# Patient Record
Sex: Female | Born: 1990 | Race: Black or African American | Hispanic: No | Smoking: Never smoker
Health system: Southern US, Community
[De-identification: ages and names within clinical notes are randomized; demographics above are authoritative.]

---

## 2013-11-25 ENCOUNTER — Ambulatory Visit (INDEPENDENT_AMBULATORY_CARE_PROVIDER_SITE_OTHER): Payer: Managed Care, Other (non HMO) | Admitting: Obstetrics & Gynecology

## 2013-11-25 ENCOUNTER — Encounter: Payer: Self-pay | Admitting: Obstetrics & Gynecology

## 2013-11-25 VITALS — BP 110/73 | Ht 62.0 in | Wt 119.0 lb

## 2013-11-25 DIAGNOSIS — Z3201 Encounter for pregnancy test, result positive: Secondary | ICD-10-CM

## 2013-11-25 DIAGNOSIS — R110 Nausea: Secondary | ICD-10-CM

## 2013-11-25 DIAGNOSIS — Z3400 Encounter for supervision of normal first pregnancy, unspecified trimester: Secondary | ICD-10-CM | POA: Insufficient documentation

## 2013-11-25 DIAGNOSIS — N949 Unspecified condition associated with female genital organs and menstrual cycle: Secondary | ICD-10-CM

## 2013-11-25 NOTE — Progress Notes (Signed)
P - 95 - Pt states she has had some abd cramping 2/10 pain scale - also started feeling nauseated 2 days ago but has not vomited.

## 2013-11-25 NOTE — Progress Notes (Signed)
Patient reports having positive UPT at home in November, LMP 09/21/13.  Clinic UPT is also positive today.  Clinic bedside transabdominal ultrasound showed possible GS and yolk sac, no discrete fetal pole or cardiac activity noted.  Patient denies any current pain, no tenderness on lower abdomen, LLQ or RLQ palpation.  Will obtain formal scan for viability check, will follow up results and manage accordingly.  Pain and bleeding precautions advised

## 2013-11-25 NOTE — Patient Instructions (Signed)
We will call you with results  

## 2013-11-27 ENCOUNTER — Ambulatory Visit (HOSPITAL_COMMUNITY)
Admission: RE | Admit: 2013-11-27 | Discharge: 2013-11-27 | Disposition: A | Discharge status: Home / Self Care | Payer: Managed Care, Other (non HMO) | Source: Ambulatory Visit | Attending: Obstetrics & Gynecology | Admitting: Obstetrics & Gynecology

## 2013-11-27 ENCOUNTER — Ambulatory Visit (HOSPITAL_COMMUNITY): Payer: Managed Care, Other (non HMO)

## 2013-11-27 DIAGNOSIS — Z3201 Encounter for pregnancy test, result positive: Secondary | ICD-10-CM

## 2013-11-27 DIAGNOSIS — Z3689 Encounter for other specified antenatal screening: Secondary | ICD-10-CM | POA: Insufficient documentation

## 2013-12-01 ENCOUNTER — Telehealth: Payer: Self-pay | Admitting: *Deleted

## 2013-12-01 NOTE — Telephone Encounter (Signed)
Called pt to go over u/s results - pt expressed understanding

## 2013-12-01 NOTE — Telephone Encounter (Signed)
Message copied by Arne Cleveland on Mon Dec 01, 2013  8:21 AM ------      Message from: Jaynie Collins A      Created: Sat Nov 29, 2013  9:47 AM       Please schedule for initial prenatal visit soon.  Dating by this ultrasound.  So relieved by this result... ------

## 2013-12-09 ENCOUNTER — Ambulatory Visit (INDEPENDENT_AMBULATORY_CARE_PROVIDER_SITE_OTHER): Payer: Managed Care, Other (non HMO) | Admitting: Obstetrics & Gynecology

## 2013-12-09 ENCOUNTER — Encounter: Payer: Self-pay | Admitting: Obstetrics & Gynecology

## 2013-12-09 VITALS — BP 118/71 | Wt 116.0 lb

## 2013-12-09 DIAGNOSIS — Z3400 Encounter for supervision of normal first pregnancy, unspecified trimester: Secondary | ICD-10-CM

## 2013-12-09 DIAGNOSIS — Z3401 Encounter for supervision of normal first pregnancy, first trimester: Secondary | ICD-10-CM

## 2013-12-09 NOTE — Patient Instructions (Signed)
Pregnancy - First Trimester  During sexual intercourse, millions of sperm go into the vagina. Only 1 sperm will penetrate and fertilize the female egg while it is in the Fallopian tube. One week later, the fertilized egg implants into the wall of the uterus. An embryo begins to develop into a baby. At 6 to 8 weeks, the eyes and face are formed and the heartbeat can be seen on ultrasound. At the end of 12 weeks (first trimester), all the baby's organs are formed. Now that you are pregnant, you will want to do everything you can to have a healthy baby. Two of the most important things are to get good prenatal care and follow your caregiver's instructions. Prenatal care is all the medical care you receive before the baby's birth. It is given to prevent, find, and treat problems during the pregnancy and childbirth.  PRENATAL EXAMS  · During prenatal visits, your weight, blood pressure, and urine are checked. This is done to make sure you are healthy and progressing normally during the pregnancy.  · A pregnant woman should gain 25 to 35 pounds during the pregnancy. However, if you are overweight or underweight, your caregiver will advise you regarding your weight.  · Your caregiver will ask and answer questions for you.  · Blood work, cervical cultures, other necessary tests, and a Pap test are done during your prenatal exams. These tests are done to check on your health and the probable health of your baby. Tests are strongly recommended and done for HIV with your permission. This is the virus that causes AIDS. These tests are done because medicines can be given to help prevent your baby from being born with this infection should you have been infected without knowing it. Blood work is also used to find out your blood type, previous infections, and follow your blood levels (hemoglobin).  · Low hemoglobin (anemia) is common during pregnancy. Iron and vitamins are given to help prevent this. Later in the pregnancy, blood  tests for diabetes will be done along with any other tests if any problems develop.  · You may need other tests to make sure you and the baby are doing well.  CHANGES DURING THE FIRST TRIMESTER   Your body goes through many changes during pregnancy. They vary from person to person. Talk to your caregiver about changes you notice and are concerned about. Changes can include:  · Your menstrual period stops.  · The egg and sperm carry the genes that determine what you look like. Genes from you and your partner are forming a baby. The female genes determine whether the baby is a boy or a girl.  · Your body increases in girth and you may feel bloated.  · Feeling sick to your stomach (nauseous) and throwing up (vomiting). If the vomiting is uncontrollable, call your caregiver.  · Your breasts will begin to enlarge and become tender.  · Your nipples may stick out more and become darker.  · The need to urinate more. Painful urination may mean you have a bladder infection.  · Tiring easily.  · Loss of appetite.  · Cravings for certain kinds of food.  · At first, you may gain or lose a couple of pounds.  · You may have changes in your emotions from day to day (excited to be pregnant or concerned something may go wrong with the pregnancy and baby).  · You may have more vivid and strange dreams.  HOME CARE INSTRUCTIONS   ·   It is very important to avoid all smoking, alcohol and non-prescribed drugs during your pregnancy. These affect the formation and growth of the baby. Avoid chemicals while pregnant to ensure the delivery of a healthy infant.  · Start your prenatal visits by the 12th week of pregnancy. They are usually scheduled monthly at first, then more often in the last 2 months before delivery. Keep your caregiver's appointments. Follow your caregiver's instructions regarding medicine use, blood and lab tests, exercise, and diet.  · During pregnancy, you are providing food for you and your baby. Eat regular, well-balanced  meals. Choose foods such as meat, fish, milk and other low fat dairy products, vegetables, fruits, and whole-grain breads and cereals. Your caregiver will tell you of the ideal weight gain.  · You can help morning sickness by keeping soda crackers at the bedside. Eat a couple before arising in the morning. You may want to use the crackers without salt on them.  · Eating 4 to 5 small meals rather than 3 large meals a day also may help the nausea and vomiting.  · Drinking liquids between meals instead of during meals also seems to help nausea and vomiting.  · A physical sexual relationship may be continued throughout pregnancy if there are no other problems. Problems may be early (premature) leaking of amniotic fluid from the membranes, vaginal bleeding, or belly (abdominal) pain.  · Exercise regularly if there are no restrictions. Check with your caregiver or physical therapist if you are unsure of the safety of some of your exercises. Greater weight gain will occur in the last 2 trimesters of pregnancy. Exercising will help:  · Control your weight.  · Keep you in shape.  · Prepare you for labor and delivery.  · Help you lose your pregnancy weight after you deliver your baby.  · Wear a good support or jogging bra for breast tenderness during pregnancy. This may help if worn during sleep too.  · Ask when prenatal classes are available. Begin classes when they are offered.  · Do not use hot tubs, steam rooms, or saunas.  · Wear your seat belt when driving. This protects you and your baby if you are in an accident.  · Avoid raw meat, uncooked cheese, cat litter boxes, and soil used by cats throughout the pregnancy. These carry germs that can cause birth defects in the baby.  · The first trimester is a good time to visit your dentist for your dental health. Getting your teeth cleaned is okay. Use a softer toothbrush and brush gently during pregnancy.  · Ask for help if you have financial, counseling, or nutritional needs  during pregnancy. Your caregiver will be able to offer counseling for these needs as well as refer you for other special needs.  · Do not take any medicines or herbs unless told by your caregiver.  · Inform your caregiver if there is any mental or physical domestic violence.  · Make a list of emergency phone numbers of family, friends, hospital, and police and fire departments.  · Write down your questions. Take them to your prenatal visit.  · Do not douche.  · Do not cross your legs.  · If you have to stand for long periods of time, rotate you feet or take small steps in a circle.  · You may have more vaginal secretions that may require a sanitary pad. Do not use tampons or scented sanitary pads.  MEDICINES AND DRUG USE IN PREGNANCY  ·   Take prenatal vitamins as directed. The vitamin should contain 1 milligram of folic acid. Keep all vitamins out of reach of children. Only a couple vitamins or tablets containing iron may be fatal to a baby or young child when ingested.  · Avoid use of all medicines, including herbs, over-the-counter medicines, not prescribed or suggested by your caregiver. Only take over-the-counter or prescription medicines for pain, discomfort, or fever as directed by your caregiver. Do not use aspirin, ibuprofen, or naproxen unless directed by your caregiver.  · Let your caregiver also know about herbs you may be using.  · Alcohol is related to a number of birth defects. This includes fetal alcohol syndrome. All alcohol, in any form, should be avoided completely. Smoking will cause low birth rate and premature babies.  · Street or illegal drugs are very harmful to the baby. They are absolutely forbidden. A baby born to an addicted mother will be addicted at birth. The baby will go through the same withdrawal an adult does.  · Let your caregiver know about any medicines that you have to take and for what reason you take them.  SEEK MEDICAL CARE IF:   You have any concerns or worries during your  pregnancy. It is better to call with your questions if you feel they cannot wait, rather than worry about them.  SEEK IMMEDIATE MEDICAL CARE IF:   · An unexplained oral temperature above 102° F (38.9° C) develops, or as your caregiver suggests.  · You have leaking of fluid from the vagina (birth canal). If leaking membranes are suspected, take your temperature and inform your caregiver of this when you call.  · There is vaginal spotting or bleeding. Notify your caregiver of the amount and how many pads are used.  · You develop a bad smelling vaginal discharge with a change in the color.  · You continue to feel sick to your stomach (nauseated) and have no relief from remedies suggested. You vomit blood or coffee ground-like materials.  · You lose more than 2 pounds of weight in 1 week.  · You gain more than 2 pounds of weight in 1 week and you notice swelling of your face, hands, feet, or legs.  · You gain 5 pounds or more in 1 week (even if you do not have swelling of your hands, face, legs, or feet).  · You get exposed to German measles and have never had them.  · You are exposed to fifth disease or chickenpox.  · You develop belly (abdominal) pain. Round ligament discomfort is a common non-cancerous (benign) cause of abdominal pain in pregnancy. Your caregiver still must evaluate this.  · You develop headache, fever, diarrhea, pain with urination, or shortness of breath.  · You fall or are in a car accident or have any kind of trauma.  · There is mental or physical violence in your home.  Document Released: 11/28/2001 Document Revised: 08/28/2012 Document Reviewed: 06/01/2009  ExitCare® Patient Information ©2014 [employer].

## 2013-12-09 NOTE — Progress Notes (Signed)
New OB, had pap 1 mo ago for exam for the [employer]. Wants first screen.  Subjective: exam after [place] for dating    Jennifer Mcconnell is a G1P0 [redacted]w[redacted]d being seen today for her first obstetrical visit.  Her obstetrical history is significant for first pregnancy. Patient does intend to breast feed. Pregnancy history fully reviewed.  Patient reports no complaints.  Filed Vitals:   12/09/13 0919  BP: 118/71  Weight: 116 lb (52.617 kg)    HISTORY: OB History  Gravida Para Term Preterm AB SAB TAB Ectopic Multiple Living  1             # Outcome Date GA Lbr Len/2nd Weight Sex Delivery Anes PTL Lv  1 CUR              No past medical history on file. No past surgical history on file. No family history on file.   Exam    Uterus:  Fundal Height: 8 cm  Pelvic Exam:    Perineum: No Hemorrhoids   Vulva: normal   Vagina:  normal mucosa   pH:     Cervix: no lesions   Adnexa: normal adnexa   Bony Pelvis: average  System: Breast:  normal appearance, no masses or tenderness   Skin: normal coloration and turgor, no rashes    Neurologic: oriented, normal mood   Extremities: normal strength, tone, and muscle mass, no deformities   HEENT neck supple with midline trachea   Mouth/Teeth mucous membranes moist, pharynx normal without lesions and dental hygiene good   Neck supple   Cardiovascular: regular rate and rhythm, no murmurs or gallops   Respiratory:  appears well, vitals normal, no respiratory distress, acyanotic, normal RR, neck free of mass or lymphadenopathy, chest clear, no wheezing, crepitations, rhonchi, normal symmetric air entry   Abdomen: soft, non-tender; bowel sounds normal; no masses,  no organomegaly   Urinary: urethral meatus normal      Assessment:    Pregnancy: G1P0 Patient Active Problem List   Diagnosis Date Noted  . Supervision of low-risk first pregnancy 11/25/2013        Plan:     Initial labs drawn. Prenatal vitamins. Problem list reviewed and  updated. Genetic Screening discussed First Screen: ordered.  Ultrasound discussed; fetal survey: 19+ weeks.  Follow up in 4 weeks. 50% of 30 min visit spent on counseling and coordination of care.  Records from exam last month  ARNOLD,JAMES 12/09/2013

## 2013-12-09 NOTE — Progress Notes (Signed)
P - 83 - Pt was seen at [facility] for abd pain - prescribed protonix and xofran

## 2014-01-06 ENCOUNTER — Ambulatory Visit (INDEPENDENT_AMBULATORY_CARE_PROVIDER_SITE_OTHER): Payer: Managed Care, Other (non HMO) | Admitting: Obstetrics & Gynecology

## 2014-01-06 VITALS — BP 130/80 | Wt 111.0 lb

## 2014-01-06 DIAGNOSIS — Z3400 Encounter for supervision of normal first pregnancy, unspecified trimester: Secondary | ICD-10-CM

## 2014-01-06 NOTE — Progress Notes (Signed)
P = 91 

## 2014-01-06 NOTE — Progress Notes (Signed)
Plans to [facility], [facility]

## 2014-01-06 NOTE — Patient Instructions (Signed)
Second Trimester of Pregnancy The second trimester is from week 13 through week 28, months 4 through 6. The second trimester is often a time when you feel your best. Your body has also adjusted to being pregnant, and you begin to feel better physically. Usually, morning sickness has lessened or quit completely, you may have more energy, and you may have an increase in appetite. The second trimester is also a time when the fetus is growing rapidly. At the end of the sixth month, the fetus is about 9 inches long and weighs about 1 pounds. You will likely begin to feel the baby move (quickening) between 18 and 20 weeks of the pregnancy. BODY CHANGES Your body goes through many changes during pregnancy. The changes vary from woman to woman.   Your weight will continue to increase. You will notice your lower abdomen bulging out.  You may begin to get stretch marks on your hips, abdomen, and breasts.  You may develop headaches that can be relieved by medicines approved by your caregiver.  You may urinate more often because the fetus is pressing on your bladder.  You may develop or continue to have heartburn as a result of your pregnancy.  You may develop constipation because certain hormones are causing the muscles that push waste through your intestines to slow down.  You may develop hemorrhoids or swollen, bulging veins (varicose veins).  You may have back pain because of the weight gain and pregnancy hormones relaxing your joints between the bones in your pelvis and as a result of a shift in weight and the muscles that support your balance.  Your breasts will continue to grow and be tender.  Your gums may bleed and may be sensitive to brushing and flossing.  Dark spots or blotches (chloasma, mask of pregnancy) may develop on your face. This will likely fade after the baby is born.  A dark line from your belly button to the pubic area (linea nigra) may appear. This will likely fade after the  baby is born. WHAT TO EXPECT AT YOUR PRENATAL VISITS During a routine prenatal visit:  You will be weighed to make sure you and the fetus are growing normally.  Your blood pressure will be taken.  Your abdomen will be measured to track your baby's growth.  The fetal heartbeat will be listened to.  Any test results from the previous visit will be discussed. Your caregiver may ask you:  How you are feeling.  If you are feeling the baby move.  If you have had any abnormal symptoms, such as leaking fluid, bleeding, severe headaches, or abdominal cramping.  If you have any questions. Other tests that may be performed during your second trimester include:  Blood tests that check for:  Low iron levels (anemia).  Gestational diabetes (between 24 and 28 weeks).  Rh antibodies.  Urine tests to check for infections, diabetes, or protein in the urine.  An ultrasound to confirm the proper growth and development of the baby.  An amniocentesis to check for possible genetic problems.  Fetal screens for spina bifida and Down syndrome. HOME CARE INSTRUCTIONS   Avoid all smoking, herbs, alcohol, and unprescribed drugs. These chemicals affect the formation and growth of the baby.  Follow your caregiver's instructions regarding medicine use. There are medicines that are either safe or unsafe to take during pregnancy.  Exercise only as directed by your caregiver. Experiencing uterine cramps is a good sign to stop exercising.  Continue to eat regular,   healthy meals.  Wear a good support bra for breast tenderness.  Do not use hot tubs, steam rooms, or saunas.  Wear your seat belt at all times when driving.  Avoid raw meat, uncooked cheese, cat litter boxes, and soil used by cats. These carry germs that can cause birth defects in the baby.  Take your prenatal vitamins.  Try taking a stool softener (if your caregiver approves) if you develop constipation. Eat more high-fiber foods,  such as fresh vegetables or fruit and whole grains. Drink plenty of fluids to keep your urine clear or pale yellow.  Take warm sitz baths to soothe any pain or discomfort caused by hemorrhoids. Use hemorrhoid cream if your caregiver approves.  If you develop varicose veins, wear support hose. Elevate your feet for 15 minutes, 3 4 times a day. Limit salt in your diet.  Avoid heavy lifting, wear low heel shoes, and practice good posture.  Rest with your legs elevated if you have leg cramps or low back pain.  Visit your dentist if you have not gone yet during your pregnancy. Use a soft toothbrush to brush your teeth and be gentle when you floss.  A sexual relationship may be continued unless your caregiver directs you otherwise.  Continue to go to all your prenatal visits as directed by your caregiver. SEEK MEDICAL CARE IF:   You have dizziness.  You have mild pelvic cramps, pelvic pressure, or nagging pain in the abdominal area.  You have persistent nausea, vomiting, or diarrhea.  You have a bad smelling vaginal discharge.  You have pain with urination. SEEK IMMEDIATE MEDICAL CARE IF:   You have a fever.  You are leaking fluid from your vagina.  You have spotting or bleeding from your vagina.  You have severe abdominal cramping or pain.  You have rapid weight gain or loss.  You have shortness of breath with chest pain.  You notice sudden or extreme swelling of your face, hands, ankles, feet, or legs.  You have not felt your baby move in over an hour.  You have severe headaches that do not go away with medicine.  You have vision changes. Document Released: 11/28/2001 Document Revised: 08/06/2013 Document Reviewed: 02/04/2013 ExitCare Patient Information 2014 [employer].  

## 2014-01-17 ENCOUNTER — Inpatient Hospital Stay: Payer: Self-pay | Admitting: Psychiatry

## 2014-01-17 LAB — DRUG SCREEN, URINE
Amphetamines, Ur Screen: NEGATIVE (ref ?–1000)
Barbiturates, Ur Screen: NEGATIVE (ref ?–200)
Benzodiazepine, Ur Scrn: NEGATIVE (ref ?–200)
COCAINE METABOLITE, UR ~~LOC~~: NEGATIVE (ref ?–300)
Cannabinoid 50 Ng, Ur ~~LOC~~: NEGATIVE (ref ?–50)
MDMA (ECSTASY) UR SCREEN: NEGATIVE (ref ?–500)
Methadone, Ur Screen: NEGATIVE (ref ?–300)
OPIATE, UR SCREEN: NEGATIVE (ref ?–300)
Phencyclidine (PCP) Ur S: NEGATIVE (ref ?–25)
Tricyclic, Ur Screen: NEGATIVE (ref ?–1000)

## 2014-01-17 LAB — ETHANOL: Ethanol %: <0.003 % (ref 0.000–0.080)

## 2014-01-17 LAB — COMPREHENSIVE METABOLIC PANEL WITH GFR
Albumin: 3.4 g/dL
Alkaline Phosphatase: 55 U/L
Anion Gap: 8
BUN: 8 mg/dL
Bilirubin,Total: 0.1 mg/dL — ABNORMAL LOW
Calcium, Total: 9.1 mg/dL
Chloride: 104 mmol/L
Co2: 24 mmol/L
Creatinine: 0.49 mg/dL — ABNORMAL LOW
EGFR (African American): >60
EGFR (Non-African Amer.): >60
Glucose: 78 mg/dL
Osmolality: 269
Potassium: 3.8 mmol/L
SGOT(AST): 16 U/L
SGPT (ALT): 8 U/L — ABNORMAL LOW
Sodium: 136 mmol/L
Total Protein: 7.4 g/dL

## 2014-01-17 LAB — URINALYSIS, COMPLETE
Bacteria: NONE SEEN
Bilirubin,UR: NEGATIVE
Blood: NEGATIVE
Glucose,UR: NEGATIVE mg/dL
Ketone: NEGATIVE
Leukocyte Esterase: NEGATIVE
Nitrite: NEGATIVE
Ph: 5
Protein: NEGATIVE
RBC,UR: <1 /HPF
Specific Gravity: 1.02
Squamous Epithelial: 8
WBC UR: 1 /HPF

## 2014-01-17 LAB — CBC
HCT: 32.3 % — AB (ref 35.0–47.0)
HGB: 10.8 g/dL — AB (ref 12.0–16.0)
MCH: 27.9 pg (ref 26.0–34.0)
MCHC: 33.3 g/dL (ref 32.0–36.0)
MCV: 84 fL (ref 80–100)
Platelet: 276 10*3/uL (ref 150–440)
RBC: 3.85 10*6/uL (ref 3.80–5.20)
RDW: 14.5 % (ref 11.5–14.5)
WBC: 9.1 10*3/uL (ref 3.6–11.0)

## 2014-01-17 LAB — TSH: THYROID STIMULATING HORM: 0.03 u[IU]/mL — AB

## 2014-01-17 LAB — SALICYLATE LEVEL: Salicylates, Serum: <1.7 mg/dL

## 2014-01-17 LAB — ACETAMINOPHEN LEVEL

## 2014-01-17 LAB — HCG, QUANTITATIVE, PREGNANCY: Beta Hcg, Quant.: 122439 m[IU]/mL — ABNORMAL HIGH

## 2014-01-18 LAB — T4, FREE: Free Thyroxine: 1.21 ng/dL (ref 0.76–1.46)

## 2014-01-19 ENCOUNTER — Encounter: Payer: Managed Care, Other (non HMO) | Admitting: Obstetrics & Gynecology

## 2014-01-27 ENCOUNTER — Ambulatory Visit (INDEPENDENT_AMBULATORY_CARE_PROVIDER_SITE_OTHER): Admitting: Obstetrics & Gynecology

## 2014-01-27 VITALS — BP 110/73 | Wt 111.0 lb

## 2014-01-27 DIAGNOSIS — K219 Gastro-esophageal reflux disease without esophagitis: Secondary | ICD-10-CM

## 2014-01-27 DIAGNOSIS — Z3400 Encounter for supervision of normal first pregnancy, unspecified trimester: Secondary | ICD-10-CM

## 2014-01-27 MED ORDER — PANTOPRAZOLE SODIUM 40 MG PO TBEC
40.0000 mg | DELAYED_RELEASE_TABLET | Freq: Every day | ORAL | Status: DC
Start: 2014-01-27 — End: 2014-03-03

## 2014-01-27 NOTE — Patient Instructions (Signed)
Second Trimester of Pregnancy The second trimester is from week 13 through week 28, months 4 through 6. The second trimester is often a time when you feel your best. Your body has also adjusted to being pregnant, and you begin to feel better physically. Usually, morning sickness has lessened or quit completely, you may have more energy, and you may have an increase in appetite. The second trimester is also a time when the fetus is growing rapidly. At the end of the sixth month, the fetus is about 9 inches long and weighs about 1 pounds. You will likely begin to feel the baby move (quickening) between 18 and 20 weeks of the pregnancy. BODY CHANGES Your body goes through many changes during pregnancy. The changes vary from woman to woman.   Your weight will continue to increase. You will notice your lower abdomen bulging out.  You may begin to get stretch marks on your hips, abdomen, and breasts.  You may develop headaches that can be relieved by medicines approved by your caregiver.  You may urinate more often because the fetus is pressing on your bladder.  You may develop or continue to have heartburn as a result of your pregnancy.  You may develop constipation because certain hormones are causing the muscles that push waste through your intestines to slow down.  You may develop hemorrhoids or swollen, bulging veins (varicose veins).  You may have back pain because of the weight gain and pregnancy hormones relaxing your joints between the bones in your pelvis and as a result of a shift in weight and the muscles that support your balance.  Your breasts will continue to grow and be tender.  Your gums may bleed and may be sensitive to brushing and flossing.  Dark spots or blotches (chloasma, mask of pregnancy) may develop on your face. This will likely fade after the baby is born.  A dark line from your belly button to the pubic area (linea nigra) may appear. This will likely fade after the  baby is born. WHAT TO EXPECT AT YOUR PRENATAL VISITS During a routine prenatal visit:  You will be weighed to make sure you and the fetus are growing normally.  Your blood pressure will be taken.  Your abdomen will be measured to track your baby's growth.  The fetal heartbeat will be listened to.  Any test results from the previous visit will be discussed. Your caregiver may ask you:  How you are feeling.  If you are feeling the baby move.  If you have had any abnormal symptoms, such as leaking fluid, bleeding, severe headaches, or abdominal cramping.  If you have any questions. Other tests that may be performed during your second trimester include:  Blood tests that check for:  Low iron levels (anemia).  Gestational diabetes (between 24 and 28 weeks).  Rh antibodies.  Urine tests to check for infections, diabetes, or protein in the urine.  An ultrasound to confirm the proper growth and development of the baby.  An amniocentesis to check for possible genetic problems.  Fetal screens for spina bifida and Down syndrome. HOME CARE INSTRUCTIONS   Avoid all smoking, herbs, alcohol, and unprescribed drugs. These chemicals affect the formation and growth of the baby.  Follow your caregiver's instructions regarding medicine use. There are medicines that are either safe or unsafe to take during pregnancy.  Exercise only as directed by your caregiver. Experiencing uterine cramps is a good sign to stop exercising.  Continue to eat regular,   healthy meals.  Wear a good support bra for breast tenderness.  Do not use hot tubs, steam rooms, or saunas.  Wear your seat belt at all times when driving.  Avoid raw meat, uncooked cheese, cat litter boxes, and soil used by cats. These carry germs that can cause birth defects in the baby.  Take your prenatal vitamins.  Try taking a stool softener (if your caregiver approves) if you develop constipation. Eat more high-fiber foods,  such as fresh vegetables or fruit and whole grains. Drink plenty of fluids to keep your urine clear or pale yellow.  Take warm sitz baths to soothe any pain or discomfort caused by hemorrhoids. Use hemorrhoid cream if your caregiver approves.  If you develop varicose veins, wear support hose. Elevate your feet for 15 minutes, 3 4 times a day. Limit salt in your diet.  Avoid heavy lifting, wear low heel shoes, and practice good posture.  Rest with your legs elevated if you have leg cramps or low back pain.  Visit your dentist if you have not gone yet during your pregnancy. Use a soft toothbrush to brush your teeth and be gentle when you floss.  A sexual relationship may be continued unless your caregiver directs you otherwise.  Continue to go to all your prenatal visits as directed by your caregiver. SEEK MEDICAL CARE IF:   You have dizziness.  You have mild pelvic cramps, pelvic pressure, or nagging pain in the abdominal area.  You have persistent nausea, vomiting, or diarrhea.  You have a bad smelling vaginal discharge.  You have pain with urination. SEEK IMMEDIATE MEDICAL CARE IF:   You have a fever.  You are leaking fluid from your vagina.  You have spotting or bleeding from your vagina.  You have severe abdominal cramping or pain.  You have rapid weight gain or loss.  You have shortness of breath with chest pain.  You notice sudden or extreme swelling of your face, hands, ankles, feet, or legs.  You have not felt your baby move in over an hour.  You have severe headaches that do not go away with medicine.  You have vision changes. Document Released: 11/28/2001 Document Revised: 08/06/2013 Document Reviewed: 02/04/2013 ExitCare Patient Information 2014 [employer].  

## 2014-01-27 NOTE — Progress Notes (Signed)
P = 76 

## 2014-01-27 NOTE — Progress Notes (Signed)
Continuing the pregnancy,agrees to Quad screen and will schedule detailed US approx 19 weeks. No complaints or questions

## 2014-01-28 LAB — OBSTETRIC PANEL
ANTIBODY SCREEN: NEGATIVE
Basophils Absolute: 0 10*3/uL (ref 0.0–0.1)
Basophils Relative: 0 % (ref 0–1)
Eosinophils Absolute: 0.1 10*3/uL (ref 0.0–0.7)
Eosinophils Relative: 1 % (ref 0–5)
HCT: 31.6 % — ABNORMAL LOW (ref 36.0–46.0)
HEMOGLOBIN: 10.4 g/dL — AB (ref 12.0–15.0)
Hepatitis B Surface Ag: NEGATIVE
LYMPHS PCT: 27 % (ref 12–46)
Lymphs Abs: 1.9 10*3/uL (ref 0.7–4.0)
MCH: 27.2 pg (ref 26.0–34.0)
MCHC: 32.9 g/dL (ref 30.0–36.0)
MCV: 82.7 fL (ref 78.0–100.0)
MONO ABS: 0.4 10*3/uL (ref 0.1–1.0)
MONOS PCT: 5 % (ref 3–12)
Neutro Abs: 4.8 10*3/uL (ref 1.7–7.7)
Neutrophils Relative %: 67 % (ref 43–77)
Platelets: 313 10*3/uL (ref 150–400)
RBC: 3.82 MIL/uL — ABNORMAL LOW (ref 3.87–5.11)
RDW: 16.1 % — ABNORMAL HIGH (ref 11.5–15.5)
RH TYPE: POSITIVE
RUBELLA: 6.19 {index} — AB (ref <0.90)
WBC: 7.2 10*3/uL (ref 4.0–10.5)

## 2014-01-28 LAB — PRESCRIPTION MONITORING PROFILE (19 PANEL)
Amphetamine/Meth: NEGATIVE ng/mL
Barbiturate Screen, Urine: NEGATIVE ng/mL
Benzodiazepine Screen, Urine: NEGATIVE ng/mL
Buprenorphine, Urine: NEGATIVE ng/mL
CARISOPRODOL, URINE: NEGATIVE ng/mL
Cannabinoid Scrn, Ur: NEGATIVE ng/mL
Cocaine Metabolites: NEGATIVE ng/mL
Creatinine, Urine: 107.11 mg/dL (ref >20.0)
Fentanyl, Ur: NEGATIVE ng/mL
MDMA URINE: NEGATIVE ng/mL
METHADONE SCREEN, URINE: NEGATIVE ng/mL
Meperidine, Ur: NEGATIVE ng/mL
Methaqualone: NEGATIVE ng/mL
NITRITES URINE, INITIAL: NEGATIVE ug/mL
OXYCODONE SCRN UR: NEGATIVE ng/mL
Opiate Screen, Urine: NEGATIVE ng/mL
PH URINE, INITIAL: 6.6 pH (ref 4.5–8.9)
PHENCYCLIDINE, UR: NEGATIVE ng/mL
Propoxyphene: NEGATIVE ng/mL
TRAMADOL UR: NEGATIVE ng/mL
Tapentadol, urine: NEGATIVE ng/mL
Zolpidem, Urine: NEGATIVE ng/mL

## 2014-01-28 LAB — ALPHA FETOPROTEIN, MATERNAL
AFP: 57.4 IU/mL
Curr Gest Age: 15.5 wks.days
MOM FOR AFP: 1.51
Open Spina bifida: NEGATIVE
Osb Risk: 1:5440 {titer}

## 2014-01-28 LAB — GC/CHLAMYDIA PROBE AMP, URINE
Chlamydia, Swab/Urine, PCR: NEGATIVE
GC PROBE AMP, URINE: NEGATIVE

## 2014-01-28 LAB — HIV ANTIBODY (ROUTINE TESTING W REFLEX): HIV: NONREACTIVE

## 2014-01-28 LAB — SICKLE CELL SCREEN: Sickle Cell Screen: NEGATIVE

## 2014-01-29 LAB — CULTURE, OB URINE
Colony Count: NO GROWTH
Organism ID, Bacteria: NO GROWTH

## 2014-01-29 LAB — CYSTIC FIBROSIS DIAGNOSTIC STUDY

## 2014-02-17 ENCOUNTER — Ambulatory Visit (HOSPITAL_COMMUNITY)
Admission: RE | Admit: 2014-02-17 | Discharge: 2014-02-17 | Disposition: A | Discharge status: Home / Self Care | Payer: Managed Care, Other (non HMO) | Source: Ambulatory Visit | Attending: Obstetrics & Gynecology | Admitting: Obstetrics & Gynecology

## 2014-02-17 ENCOUNTER — Other Ambulatory Visit: Payer: Self-pay | Admitting: Obstetrics & Gynecology

## 2014-02-17 ENCOUNTER — Ambulatory Visit (HOSPITAL_COMMUNITY)

## 2014-02-17 DIAGNOSIS — Z3400 Encounter for supervision of normal first pregnancy, unspecified trimester: Secondary | ICD-10-CM

## 2014-02-17 DIAGNOSIS — Z3689 Encounter for other specified antenatal screening: Secondary | ICD-10-CM | POA: Insufficient documentation

## 2014-02-24 ENCOUNTER — Encounter: Admitting: Obstetrics & Gynecology

## 2014-03-03 ENCOUNTER — Ambulatory Visit (INDEPENDENT_AMBULATORY_CARE_PROVIDER_SITE_OTHER): Admitting: Obstetrics & Gynecology

## 2014-03-03 VITALS — BP 113/81 | Wt 113.0 lb

## 2014-03-03 DIAGNOSIS — K219 Gastro-esophageal reflux disease without esophagitis: Secondary | ICD-10-CM

## 2014-03-03 DIAGNOSIS — T753XXA Motion sickness, initial encounter: Secondary | ICD-10-CM

## 2014-03-03 DIAGNOSIS — Z3400 Encounter for supervision of normal first pregnancy, unspecified trimester: Secondary | ICD-10-CM

## 2014-03-03 MED ORDER — MECLIZINE HCL 25 MG PO TABS: ORAL_TABLET | ORAL | Status: AC

## 2014-03-03 MED ORDER — ONDANSETRON HCL 4 MG PO TABS: 4.0000 mg | ORAL_TABLET | Freq: Three times a day (TID) | ORAL | Status: AC | PRN

## 2014-03-03 MED ORDER — PANTOPRAZOLE SODIUM 40 MG PO TBEC: 40.0000 mg | DELAYED_RELEASE_TABLET | Freq: Every day | ORAL | Status: AC

## 2014-03-03 MED ORDER — MECLIZINE HCL 32 MG PO TABS: 32.0000 mg | ORAL_TABLET | Freq: Three times a day (TID) | ORAL | Status: DC | PRN

## 2014-03-03 NOTE — Progress Notes (Signed)
P - 89 - [facility]

## 2014-03-03 NOTE — Progress Notes (Signed)
Normal anatomy 02/17/14 but limited view of LVOT, will arrange f/u

## 2014-03-03 NOTE — Patient Instructions (Signed)
Motion Sickness Motion sickness is an unpleasant, temporary feeling of dizziness, nausea, and vomiting that occurs when a person is traveling. It can occur during travel by boat, car, airplane, or even on an amusement park ride. The symptoms of motion sickness usually get better once the motion or traveling stops, but problems may persist for hours or days. CAUSES  Motion of the body can cause fluid changes in your inner ear, which can result in motion sickness. Some people are more susceptible to motion sickness than others. Stress, other illnesses, or drinking too much alcohol may add to motion sickness. SYMPTOMS   Nausea.  Dizziness.  Unsteadiness when walking.  Vomiting. DIAGNOSIS  Motion sickness is diagnosed based on your symptoms while you are traveling or moving. TREATMENT  Most people improve rapidly after the motion stops. There are also over-the-counter medicines that can help with motion sickness. Your caregiver can prescribe motion sickness patches. These patches are placed behind your ear. PREVENTION   Avoid situations that cause your motion sickness, if possible.  Consider taking medicines such as meclizine or dimenhydrinate before going on a trip that may cause motion sickness.  Do not eat large meals or drink alcohol before or during travel.  Take small, frequent sips of liquids as needed.  Sit in an area of the airplane or boat with the least motion. On an airplane, sit near the wing. Lie back in your seat, if possible. When riding in a car, try to avoid sitting in the backseat.  Breathe slowly and deeply.  Do not read or focus on nearby objects, if possible, especially if the water or air is rough. However, watching the horizon or a distant object is sometimes helpful, especially in a boat.  Avoid areas where people are smoking, if possible.  Plan ahead for vacations. Your caregiver can help you with a prescription or suggestions for over-the-counter  medicines. HOME CARE INSTRUCTIONS   Only take over-the-counter or prescription medicines as directed by your caregiver.  If you use a motion sickness patch, wash your hands after you put the patch on. Touching your hands to your eyes after using the patch can enlarge (dilate) your pupils for 1 to 2 days and disturb your vision. SEEK MEDICAL CARE IF:   Your vomiting or nausea cannot be controlled with medicine and rest.  You notice blood in your vomit. This could be dark red or look like coffee grounds.  You faint or have severe dizziness or lightheadedness upon standing. These may be signs of dehydration.  You have a fever.  You have severe abdominal or chest pain.  You have trouble breathing.  You have a severe headache.  You develop weakness or numbness on one side of the body.  You have trouble speaking. MAKE SURE YOU:   Understand these instructions.  Will watch your condition.  Will get help right away if you are not doing well or get worse. Document Released: 12/04/2005 Document Revised: 02/26/2012 Document Reviewed: 07/04/2011 [facility] Patient Information 2014 [place].

## 2014-03-04 ENCOUNTER — Telehealth: Payer: Self-pay | Admitting: Obstetrics & Gynecology

## 2014-03-04 DIAGNOSIS — R710 Precipitous drop in hematocrit: Secondary | ICD-10-CM

## 2014-03-04 NOTE — Telephone Encounter (Signed)
Jennifer BoucheDonna Mcconnell called about the patient's  Hemoglobin, 8.4

## 2014-03-11 MED ORDER — FERROUS SULFATE 325 (65 FE) MG PO TABS: 325.0000 mg | ORAL_TABLET | Freq: Two times a day (BID) | ORAL | Status: AC

## 2014-03-11 NOTE — Telephone Encounter (Signed)
Called Holley BoucheDonna Pettaway at [phone], she adv they perform a fingerstick for Hgb result. [facility] - Called Dr Debroah LoopArnold to get medication order - will send in script for Iron 325mg  [facility]

## 2014-03-17 ENCOUNTER — Encounter: Payer: Self-pay | Admitting: *Deleted

## 2014-03-30 ENCOUNTER — Ambulatory Visit (HOSPITAL_COMMUNITY)
Admission: RE | Admit: 2014-03-30 | Discharge: 2014-03-30 | Disposition: A | Discharge status: Home / Self Care | Payer: Managed Care, Other (non HMO) | Source: Ambulatory Visit | Attending: Obstetrics & Gynecology | Admitting: Obstetrics & Gynecology

## 2014-03-30 DIAGNOSIS — Z3400 Encounter for supervision of normal first pregnancy, unspecified trimester: Secondary | ICD-10-CM

## 2014-03-30 DIAGNOSIS — Z3689 Encounter for other specified antenatal screening: Secondary | ICD-10-CM | POA: Insufficient documentation

## 2014-03-30 IMAGING — US US OB FOLLOW-UP
1 series · 12 of 28 positions shown · non-contrast
Comparison: none

OBSTETRICS REPORT
                      (Signed Final [DATE] [DATE])

Service(s) Provided
 US OB FOLLOW UP                                       76816.1
Indications
 Follow-up incomplete fetal anatomic evaluation        [0Z]
Fetal Evaluation
 Num Of Fetuses:    1
 Fetal Heart Rate:  165                          bpm
 Cardiac Activity:  Observed
 Presentation:      Cephalic
 Placenta:          Posterior, above cervical
                    os
 P. Cord            Marginal insertion
 Insertion:
 Amniotic Fluid
 AFI FV:      Subjectively within normal limits
                                             Larg Pckt:    4.49  cm
Biometry
 BPD:       61  mm     G. Age:  24w 6d                CI:        72.81   70 - 86
                                                      FL/HC:      19.9   18.7 -
 HC:     227.3  mm     G. Age:  24w 5d       38  %    HC/AC:      1.15   1.04 -
 AC:     197.4  mm     G. Age:  24w 3d       36  %    FL/BPD:     74.1   71 - 87
 FL:      45.2  mm     G. Age:  25w 0d       49  %    FL/AC:      22.9   20 - 24
 HUM:       41  mm     G. Age:  24w 6d       48  %
 Est. FW:     720  gm      1 lb 9 oz     54  %
Gestational Age
 U/S Today:     24w 5d                                        EDD:   [DATE]
 Best:          24w 4d     Det. By:  Early Ultrasound         EDD:   [DATE]
Anatomy
 Cranium:          Appears normal         Aortic Arch:      Appears normal
 Fetal Cavum:      Previously seen        Ductal Arch:      Appears normal
 Ventricles:       Appears normal         Diaphragm:        Appears normal
 Choroid Plexus:   Previously seen        Stomach:          Appears normal, left
                                                            sided
 Cerebellum:       Previously seen        Abdomen:          Appears normal
 Posterior Fossa:  Previously seen        Abdominal Wall:   Previously seen
 Nuchal Fold:      Previously seen        Cord Vessels:     Previously seen
 Face:             Orbits and profile     Kidneys:          Appear normal
                   previously seen
 Lips:             Previously seen        Bladder:          Appears normal
 Heart:            Appears normal         Spine:            Previously seen
                   (4CH, axis, and
                   situs)
 RVOT:             Appears normal         Lower             Previously seen
                                          Extremities:
 LVOT:             Appears normal         Upper             Previously seen
 Other:  Fetus appears to be a female.  Heels, 5th digits and nasal bone
         previously visualized.
Cervix Uterus Adnexa
 Cervical Length:    3.1      cm
 Cervix:       Normal appearance by transabdominal scan.
 Uterus:       No abnormality visualized.
 Cul De Sac:   No free fluid seen.
 Left Ovary:    Not visualized.
 Right Ovary:   Not visualized.
 Adnexa:     No abnormality visualized.
Impression
INDICATION: 22 yr old G1P0 at [0Z] for follow up ultrasound
 to complete anatomy. Remote read.

[Series 1: us ob follow up · 12 of 68 slices shown]
[im 3/68]
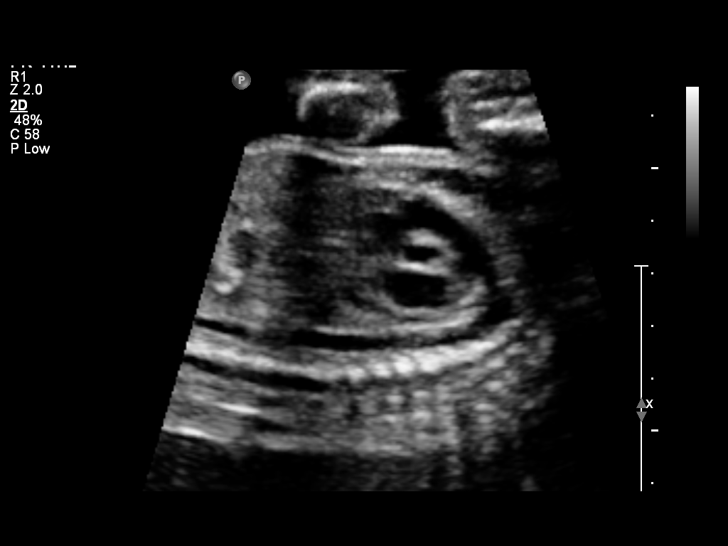
[im 8/68]
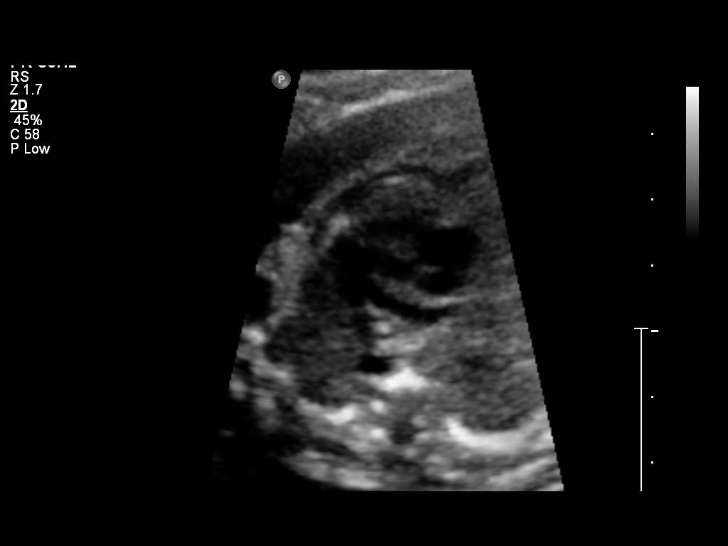
[im 13/68]
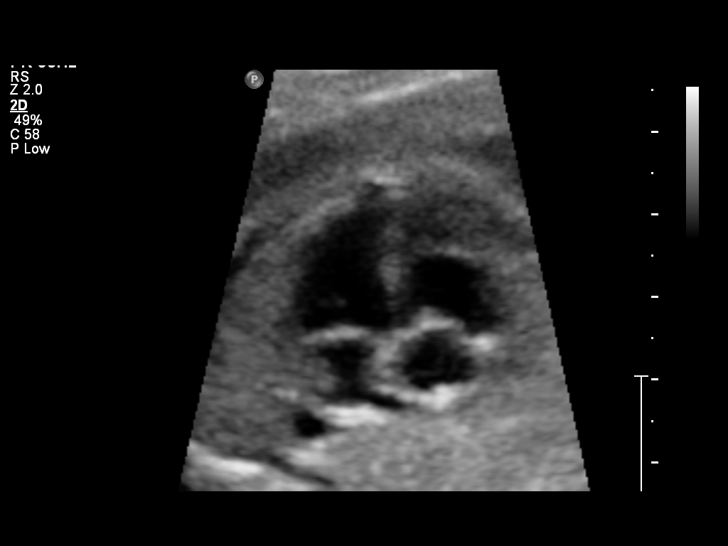
[im 20/68]
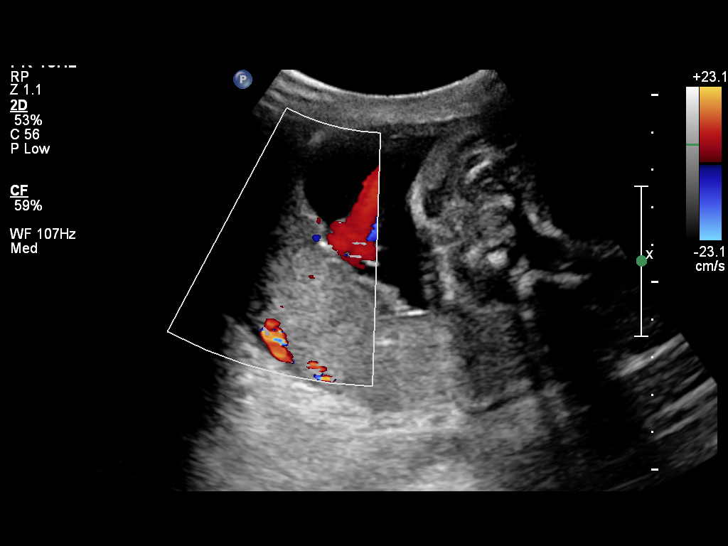
[im 25/68]
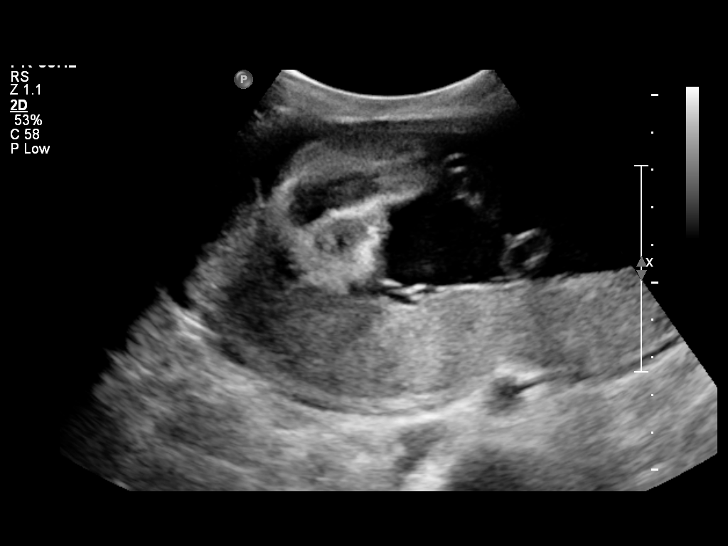
[im 30/68]
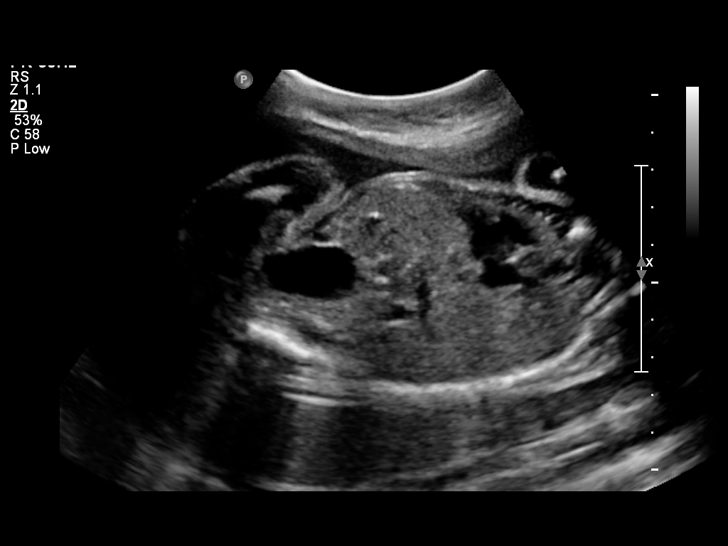
[im 38/68]
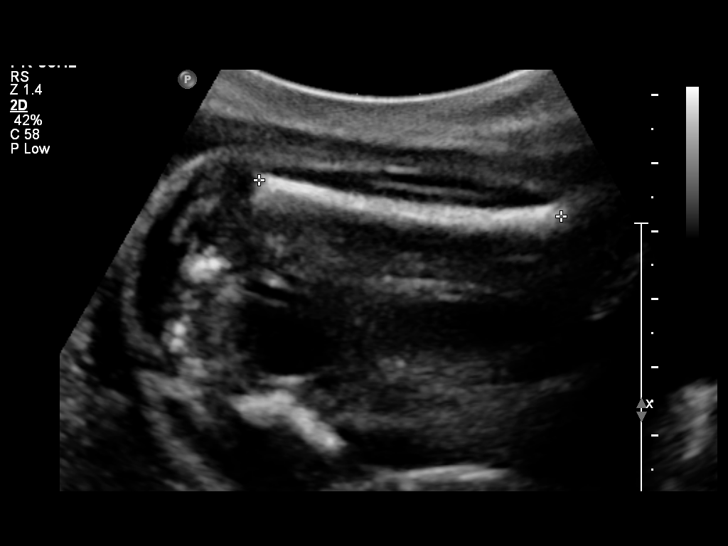
[im 43/68]
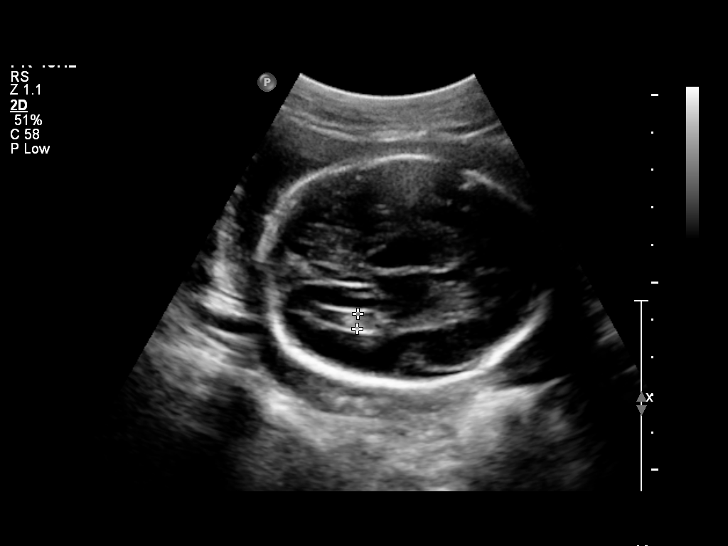
[im 48/68]
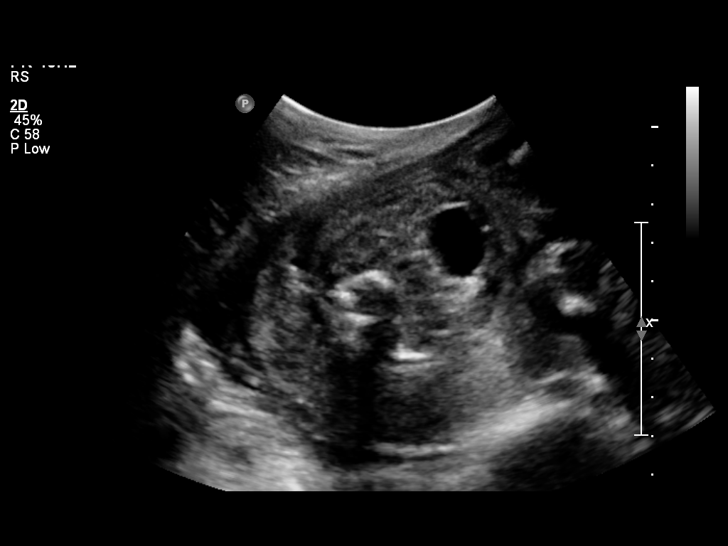
[im 55/68]
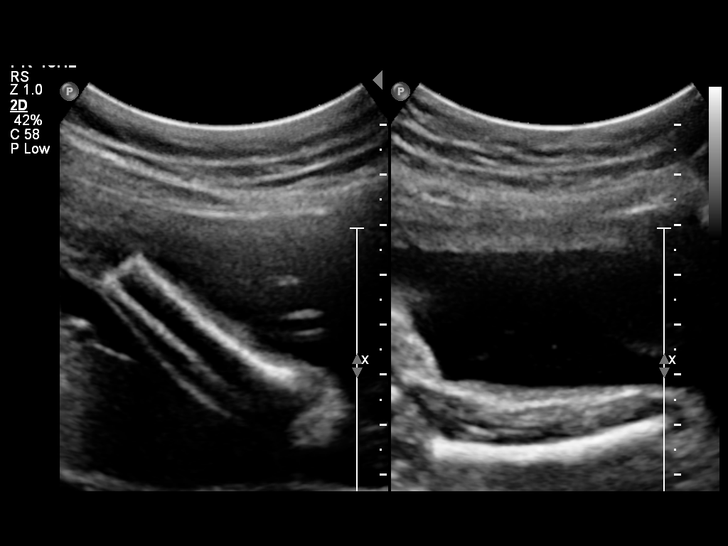
[im 60/68]
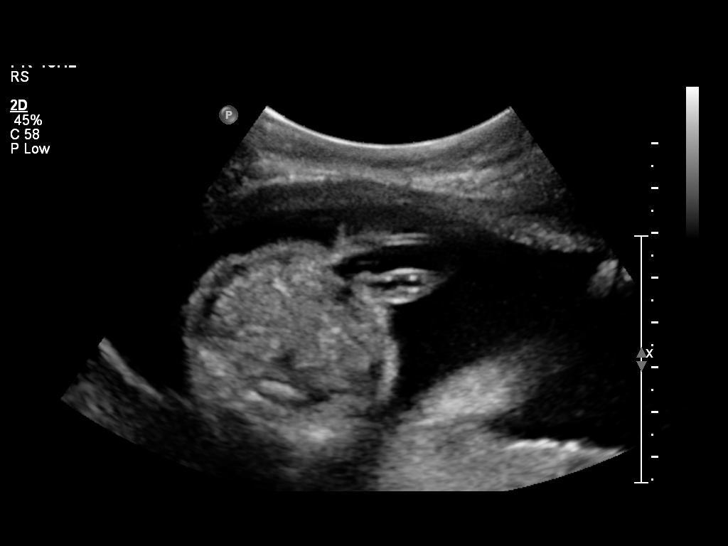
[im 65/68]
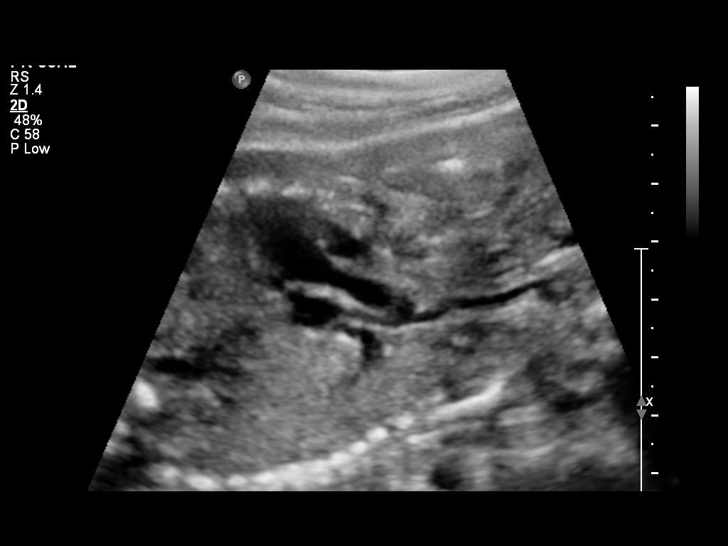

[12 of 28 positions shown; findings below may reference images not displayed]

FINDINGS: 1. Single intrauterine pregnancy.
 2. Estimated fetal weight is in the 54th%.
 3. Posterior placenta without evidence of previa.
 4. There is a marginal placental cord insertion.
 5. Normal amniotic fluid volume.
 6. Normal transabdominal cervical length.
 7. The limited anatomy survey is normal. Any anatomy not
 evaluated on today's exam was evaluated on the previous
 exam.
Recommendations

 1. Appropriate fetal growth.
 2. Fetal anatomic survey is complete.
 3. Marginal placental cord insertion:
 - no follow up indicated
 4. Recommend follow up ultrasounds as clinically indicated

 questions or concerns.

## 2014-03-31 ENCOUNTER — Encounter: Payer: Self-pay | Admitting: Obstetrics & Gynecology

## 2014-03-31 ENCOUNTER — Ambulatory Visit (INDEPENDENT_AMBULATORY_CARE_PROVIDER_SITE_OTHER): Admitting: Obstetrics & Gynecology

## 2014-03-31 VITALS — BP 119/67 | Wt 119.0 lb

## 2014-03-31 DIAGNOSIS — Z3400 Encounter for supervision of normal first pregnancy, unspecified trimester: Secondary | ICD-10-CM

## 2014-03-31 NOTE — Progress Notes (Signed)
p-83  Needs RX for Protonix

## 2014-03-31 NOTE — Progress Notes (Signed)
US f/u yesterday normal, marginal cord   1 hr GTT next

## 2014-04-07 ENCOUNTER — Encounter: Payer: Self-pay | Admitting: Obstetrics & Gynecology

## 2014-04-08 ENCOUNTER — Encounter: Payer: Self-pay | Admitting: *Deleted

## 2014-04-28 ENCOUNTER — Encounter: Admitting: Obstetrics & Gynecology

## 2014-06-09 ENCOUNTER — Encounter (HOSPITAL_COMMUNITY): Payer: Self-pay

## 2014-09-30 ENCOUNTER — Encounter (HOSPITAL_COMMUNITY): Payer: Self-pay | Admitting: *Deleted

## 2014-10-19 ENCOUNTER — Encounter (HOSPITAL_COMMUNITY): Payer: Self-pay | Admitting: *Deleted

## 2015-04-10 NOTE — Discharge Summary (Signed)
PATIENT NAME:  Jennifer Mcconnell, Aimie MR#:  [id number] DATE OF BIRTH:  Apr 03, 1991  DATE OF ADMISSION:  01/17/2014 DATE OF DISCHARGE:  01/23/2014  HOSPITAL COURSE: See dictated history and physical for details of admission.  This 24 year old woman was admitted to the hospital with symptoms of severe major depression.  She had had some recent suicidal ideation.  In the hospital, she was noticed to be depressed in her affect, slow in her thinking, negative in her thinking and have some hopelessness.  She was open to treatment however and was started on fluoxetine 10 mg a day.  She also is pregnant and appears to have the intention to continue the pregnancy.  The patient was educated about the use of medication and the risks and benefits in pregnancy and agreed to treatment.  Tolerated medicine well.  Also attended groups and individual therapy.  Suicidal ideation improved.  Cognitive slowing improved.  By the time of discharge the patient was still having some symptoms of depression, but mood and affect were significantly better.  Tolerated medicine well.  No longer endorsed any suicidal ideation.  She was agreeable to outpatient treatment.  She has been counseled and educated about depression and agrees to continue follow-up treatment as prescribed.   DISCHARGE MEDICATIONS:  Fluoxetine 10 mg per day, prenatal vitamin one per day.   MENTAL STATUS EXAMINATION AT DISCHARGE:  Neatly dressed and groomed woman who looks her stated age.  Cooperative with the interview.  Good eye contact.  Somewhat slow psychomotor activity.  Speech still a little bit slow and quiet.  Affect blunted, but not quite as badly as before.  Mood stated as being a little better.  Thoughts are generally lucid although slow.  Denies suicidal or homicidal ideation.  Has positive ideas about the future.  Denies hallucinations.  Judgment and insight are improved.  Intelligence normal.  Alert and oriented x 4, short and long-term memory grossly intact.    LABORATORY RESULTS:  Labs done on admission included a pregnancy test that was negative.  Her TSH was low, but free thyroxine normal at 1.2.  Urinalysis unremarkable.  Drug screen negative.  CBC, slightly low hematocrit at 32.3.  Chemistry panel, low creatinine at 0.49, low bilirubin 0.1.   DISPOSITION:  She is discharged from the hospital and has been referred for outpatient treatment at [facility] in [place].   DIAGNOSIS, PRINCIPAL AND PRIMARY:  AXIS I:  Major depression, severe, single.   SECONDARY DIAGNOSES: AXIS I:  No further.  AXIS II:  No diagnosis.  AXIS III:  Intrauterine pregnancy.  Mild anemia.  AXIS IV:  Severe stress from recent break-up of her marriage and loss of work.  AXIS V:  Functioning at time of discharge 55.    ____________________________ Audery AmelJohn T. Atari Novick, MD jtc:ea D: 01/23/2014 23:52:09 ET T: 01/24/2014 00:13:32 ET JOB#: [id number]  cc: Audery AmelJohn T. Walt Geathers, MD, <Dictator> Audery AmelJOHN T Yetunde Leis MD ELECTRONICALLY SIGNED 01/27/2014 10:04

## 2015-04-10 NOTE — H&P (Signed)
PATIENT NAME:  Jennifer Mcconnell, Jennifer Mcconnell MR#:  [id number] DATE OF BIRTH:  07-07-1991  DATE OF ADMISSION:  01/17/2014  DATE OF ASSESSMENT: 01/18/2014   IDENTIFYING INFORMATION AND CHIEF COMPLAINT: A 24 year old woman who presented voluntarily to the Emergency Room with a chief complaint "I'm feeling very hopeless."   HISTORY OF PRESENT ILLNESS: Information obtained from the patient and the chart. The patient came into the Emergency Room last night saying that she was feeling extremely sad and depressed. This has been going on for a couple of months. Her mood feels sad and down all of the time. She feels hopeless and helpless, like she does not know what to do. She feels restless at night, but she is tired and sleeps much of the time during the day. She stopped being able to do her work back in December and quit her job. She is having a hard time taking care of herself. The patient is currently [redacted] weeks pregnant. She had been enthusiastic about the pregnancy at first but since becoming depressed has been less and less enthusiastic about it. She cites the big trigger for this depression being that she and her husband are getting separated. She had not seen this coming, and he called her and told her he wanted a separation back in November. Since then, she has felt increasingly hopeless. She has not talked to her a doctor or professional about it until now. Not on any medication.   PAST PSYCHIATRIC HISTORY: The patient denies any previous history of depression. No history of psychiatric hospitalization or treatment. Never been on psychiatric medicine.   SOCIAL HISTORY: Currently living with her own parents. She has no children yet. She is [redacted] weeks pregnant. The patient is in [employer]. She had been working at a call center. She is married, but her husband, who is also [employer], is currently [facility] at [employer] and has told her that he wants to get separated and divorced.   MEDICAL HISTORY: Other than  being pregnant, has no significant known ongoing medical problems.   FAMILY HISTORY: Does not know of any family history of mental illness.   SUBSTANCE ABUSE HISTORY: Denies alcohol or drug use currently or in the past.   REVIEW OF SYSTEMS: Depressed mood. Hopelessness. Helplessness. Extreme fatigue and mental slowing. Denies auditory or visual hallucinations. Denies current suicidal ideation, although she had made some suicidal statements when she came to the Emergency Room last night. No specific physical complaints currently. She has not had much of an appetite and has not gained weight.   CURRENT MEDICATIONS: Prenatal vitamin.   ALLERGIES: No known drug allergies.   MENTAL STATUS EXAM: Reasonably well-groomed young woman, looks her stated age. Passively cooperative. Eye contact good. Psychomotor activity extremely slow. She sits very still and barely moves. Speaks in a whisper and has a great deal of thought slowing with a paucity of expression. Affect flat. Mood stated as depressed. Thoughts are lucid. No obvious loosening of associations or bizarre thinking. Denies auditory or visual hallucinations. She is currently denying suicidal or homicidal ideation. Judgment and insight impaired. Intelligence average. Alert and oriented x 4.   PHYSICAL EXAMINATION:  SKIN: No acute skin lesions identified.  HEENT: Pupils equal and reactive. Face symmetric. Oral mucosa dry.  NECK AND BACK: Nontender.  NEUROLOGIC: Her gait is within normal limits. Normal strength and reflexes throughout. Cranial nerves symmetric and normal.  LUNGS: Clear without wheezes.  HEART: Regular rate and rhythm. No extra sounds.  ABDOMEN:  Soft, nontender. Normal bowel sounds. She does not look like she has gained much weight. You can barely tell, really not at all, that she is pregnant.  VITAL SIGNS: Temperature 98.6, pulse 105, respirations 18, blood pressure 108/75.   LABORATORY RESULTS: Her pregnancy test was positive. Her  chemistry panel showed a low creatinine at 0.49. Bilirubin low at 0.18. LT low at 8. No other abnormalities. CBC showed hematocrit low at 32.3. Thyroid-stimulating hormone was almost nonexistent. On the other hand, the free thyroxine is well within the normal limits. Urinalysis normal. Drug screen negative.   ASSESSMENT: A 24 year old woman who presents with multiple symptoms of severe major depression. Major social stress in her life. Unfunctional. Reported some suicidal ideation last night, although now she is denying it.   TREATMENT PLAN: She is admitted to psychiatry. She is severely depressed, requires treatment. I have already checked the free thyroxine to follow up on the abnormal lab. I spoke with her about starting medication and suggested that we start with fluoxetine. I informed her that there were risks given that she was pregnant but that the severity of her depression along with her poor appetite and poor physical activity and self-care made the depression itself a risk to the pregnancy. She was agreeable to starting Prozac 10 mg a day. We will try to engage her in groups and activities and see if we can get collateral information from her family.   DIAGNOSIS, PRINCIPAL AND PRIMARY:  AXIS I: Major depression, severe, single episode.   SECONDARY DIAGNOSES:  AXIS I: None.  AXIS II: None.  AXIS III: Intrauterine pregnancy.  AXIS IV: Severe from break up with her husband, poor functioning, inability to work.  AXIS V: Functioning at time of evaluation 30.    ____________________________ Audery Amel, MD jtc:gb D: 01/18/2014 23:25:35 ET T: 01/19/2014 04:45:20 ET JOB#: [id number]  cc: Audery Amel, MD, <Dictator> Audery Amel MD ELECTRONICALLY SIGNED 01/19/2014 9:37
